# Patient Record
Sex: Female | Born: 1960 | Race: White | Hispanic: No | Marital: Married
Health system: Southern US, Community
[De-identification: ages and names within clinical notes are randomized; demographics above are authoritative.]

---

## 1997-07-26 ENCOUNTER — Ambulatory Visit (HOSPITAL_COMMUNITY): Admission: RE | Admit: 1997-07-26 | Discharge: 1997-07-26 | Payer: Self-pay | Admitting: Infectious Diseases

## 2001-10-28 ENCOUNTER — Encounter: Payer: Self-pay | Admitting: *Deleted

## 2001-10-28 ENCOUNTER — Ambulatory Visit (HOSPITAL_COMMUNITY): Admission: RE | Admit: 2001-10-28 | Discharge: 2001-10-28 | Payer: Self-pay | Admitting: *Deleted

## 2007-01-21 ENCOUNTER — Encounter: Admission: RE | Admit: 2007-01-21 | Discharge: 2007-01-21 | Payer: Self-pay | Admitting: Family Medicine

## 2013-05-21 ENCOUNTER — Other Ambulatory Visit (HOSPITAL_COMMUNITY): Payer: Self-pay | Admitting: Nephrology

## 2013-05-21 ENCOUNTER — Ambulatory Visit (HOSPITAL_COMMUNITY)
Admission: RE | Admit: 2013-05-21 | Discharge: 2013-05-21 | Disposition: A | Discharge status: Home / Self Care | Payer: Managed Care, Other (non HMO) | Source: Ambulatory Visit | Attending: Nephrology | Admitting: Nephrology

## 2013-05-21 DIAGNOSIS — R7611 Nonspecific reaction to tuberculin skin test without active tuberculosis: Secondary | ICD-10-CM | POA: Insufficient documentation

## 2013-05-21 DIAGNOSIS — Z0000 Encounter for general adult medical examination without abnormal findings: Secondary | ICD-10-CM

## 2016-01-13 DIAGNOSIS — J209 Acute bronchitis, unspecified: Secondary | ICD-10-CM | POA: Diagnosis not present

## 2016-04-04 DIAGNOSIS — Z79899 Other long term (current) drug therapy: Secondary | ICD-10-CM | POA: Diagnosis not present

## 2016-04-04 DIAGNOSIS — I10 Essential (primary) hypertension: Secondary | ICD-10-CM | POA: Diagnosis not present

## 2016-04-04 DIAGNOSIS — E7800 Pure hypercholesterolemia, unspecified: Secondary | ICD-10-CM | POA: Diagnosis not present

## 2017-02-27 DIAGNOSIS — E669 Obesity, unspecified: Secondary | ICD-10-CM | POA: Diagnosis not present

## 2017-02-27 DIAGNOSIS — Z0000 Encounter for general adult medical examination without abnormal findings: Secondary | ICD-10-CM | POA: Diagnosis not present

## 2017-02-27 DIAGNOSIS — Z01419 Encounter for gynecological examination (general) (routine) without abnormal findings: Secondary | ICD-10-CM | POA: Diagnosis not present

## 2017-03-18 DIAGNOSIS — E785 Hyperlipidemia, unspecified: Secondary | ICD-10-CM | POA: Diagnosis not present

## 2017-03-18 DIAGNOSIS — Z6841 Body Mass Index (BMI) 40.0 and over, adult: Secondary | ICD-10-CM | POA: Diagnosis not present

## 2017-03-18 DIAGNOSIS — E669 Obesity, unspecified: Secondary | ICD-10-CM | POA: Diagnosis not present

## 2017-07-25 DIAGNOSIS — I83813 Varicose veins of bilateral lower extremities with pain: Secondary | ICD-10-CM | POA: Diagnosis not present

## 2017-07-25 DIAGNOSIS — I83893 Varicose veins of bilateral lower extremities with other complications: Secondary | ICD-10-CM | POA: Diagnosis not present

## 2017-08-21 DIAGNOSIS — I83893 Varicose veins of bilateral lower extremities with other complications: Secondary | ICD-10-CM | POA: Diagnosis not present

## 2017-08-21 DIAGNOSIS — I83813 Varicose veins of bilateral lower extremities with pain: Secondary | ICD-10-CM | POA: Diagnosis not present

## 2017-08-29 DIAGNOSIS — I83813 Varicose veins of bilateral lower extremities with pain: Secondary | ICD-10-CM | POA: Diagnosis not present

## 2017-12-18 DIAGNOSIS — Z6839 Body mass index (BMI) 39.0-39.9, adult: Secondary | ICD-10-CM | POA: Diagnosis not present

## 2017-12-18 DIAGNOSIS — H8110 Benign paroxysmal vertigo, unspecified ear: Secondary | ICD-10-CM | POA: Diagnosis not present

## 2018-04-08 DIAGNOSIS — Z01419 Encounter for gynecological examination (general) (routine) without abnormal findings: Secondary | ICD-10-CM | POA: Diagnosis not present

## 2018-04-08 DIAGNOSIS — I10 Essential (primary) hypertension: Secondary | ICD-10-CM | POA: Diagnosis not present

## 2018-04-08 DIAGNOSIS — N3941 Urge incontinence: Secondary | ICD-10-CM | POA: Diagnosis not present

## 2019-03-06 ENCOUNTER — Other Ambulatory Visit: Payer: Self-pay

## 2019-03-06 ENCOUNTER — Other Ambulatory Visit: Payer: Self-pay | Admitting: Nephrology

## 2019-03-06 ENCOUNTER — Ambulatory Visit
Admission: RE | Admit: 2019-03-06 | Discharge: 2019-03-06 | Disposition: A | Discharge status: Home / Self Care | Payer: 59 | Source: Ambulatory Visit | Attending: Nephrology | Admitting: Nephrology

## 2019-03-06 DIAGNOSIS — Z111 Encounter for screening for respiratory tuberculosis: Secondary | ICD-10-CM

## 2020-05-05 ENCOUNTER — Other Ambulatory Visit: Payer: Self-pay | Admitting: Nephrology

## 2020-05-05 ENCOUNTER — Other Ambulatory Visit: Payer: Self-pay

## 2020-05-05 ENCOUNTER — Ambulatory Visit
Admission: RE | Admit: 2020-05-05 | Discharge: 2020-05-05 | Disposition: A | Discharge status: Home / Self Care | Payer: 59 | Source: Ambulatory Visit | Attending: Nephrology | Admitting: Nephrology

## 2020-05-05 DIAGNOSIS — Z9289 Personal history of other medical treatment: Secondary | ICD-10-CM

## 2021-01-25 IMAGING — CR DG CHEST 2V
2 series · 2 of 2 positions shown · non-contrast
Comparison: 05/21/2013

CLINICAL DATA: Positive PPD

EXAM:
CHEST - 2 VIEW

[w chest pa]
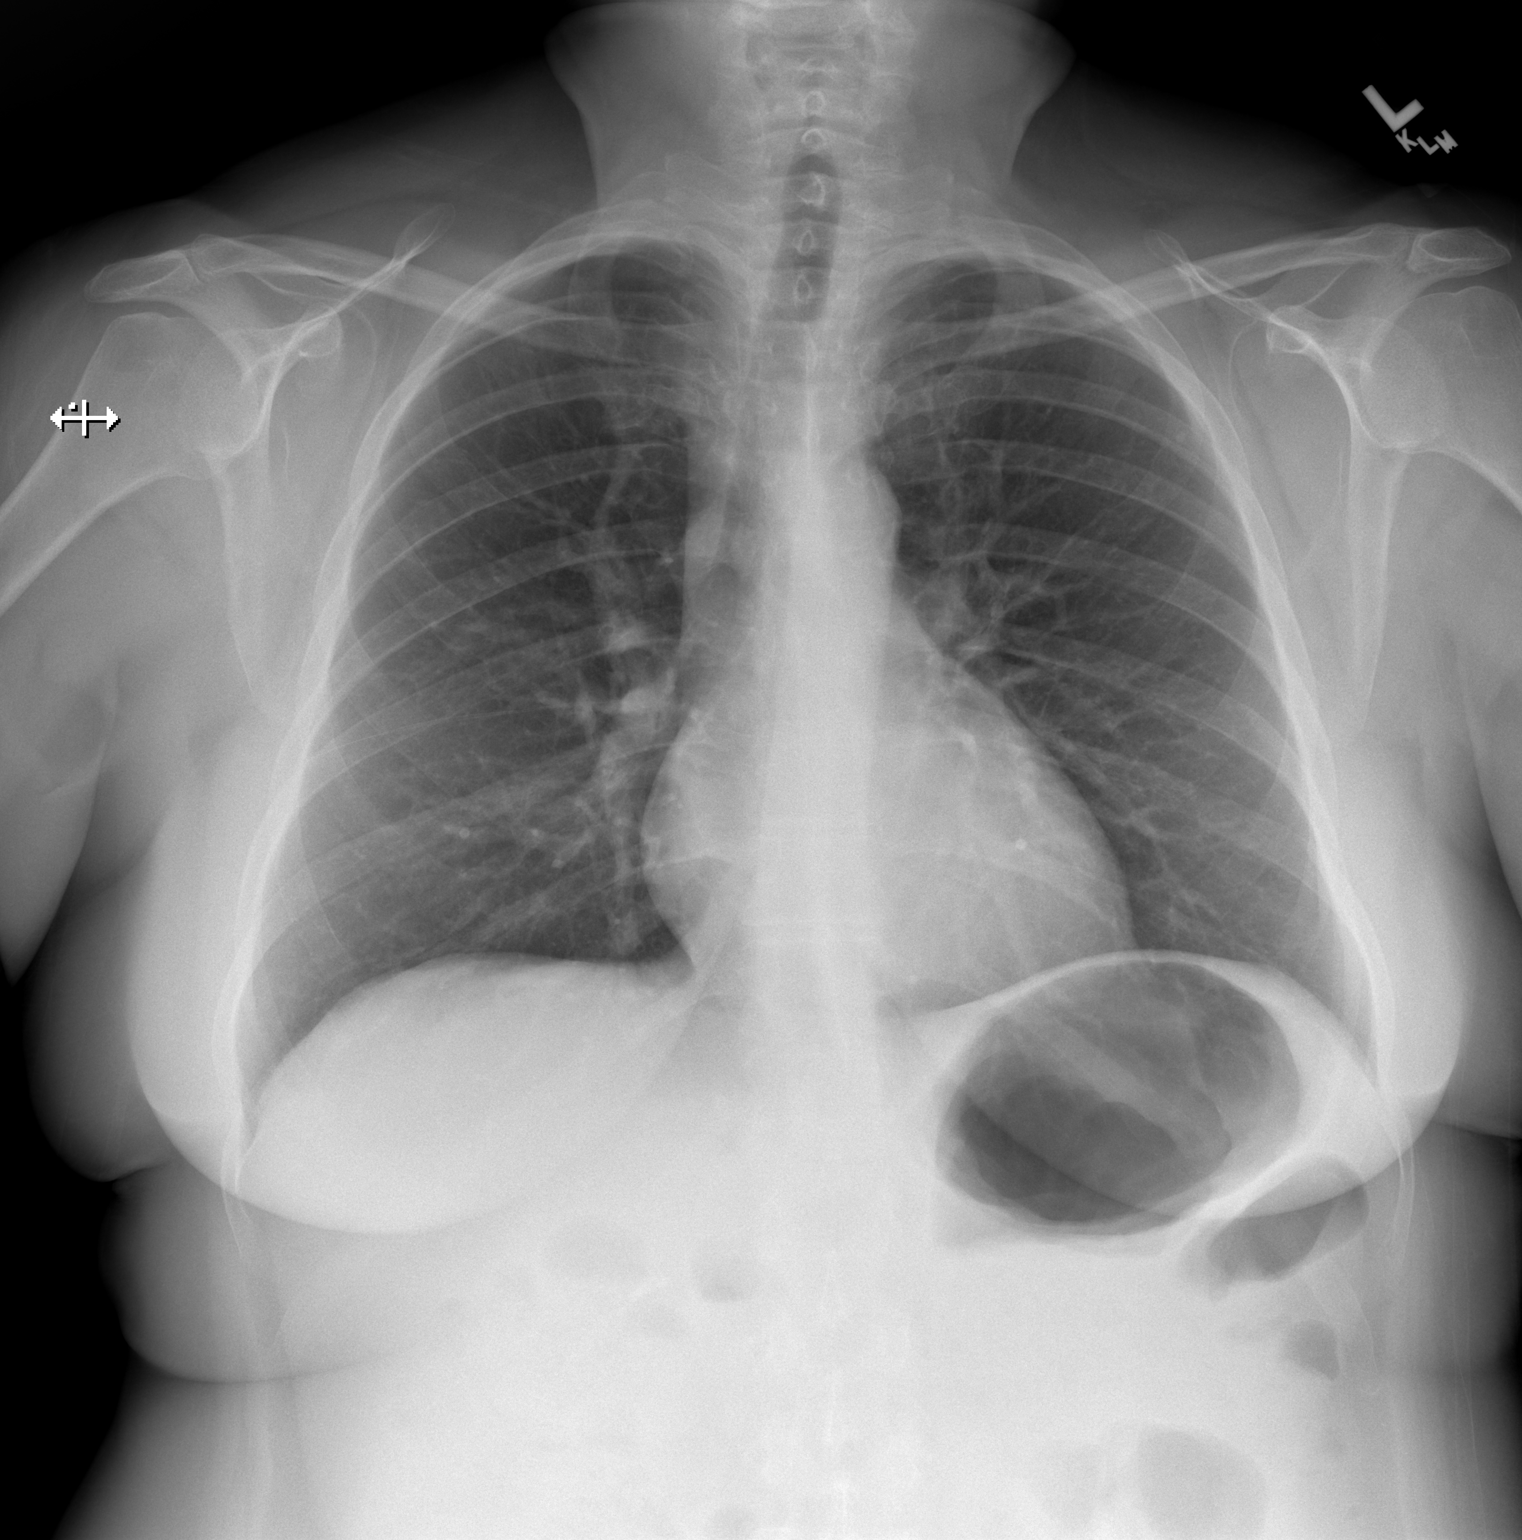

[w chest lat]
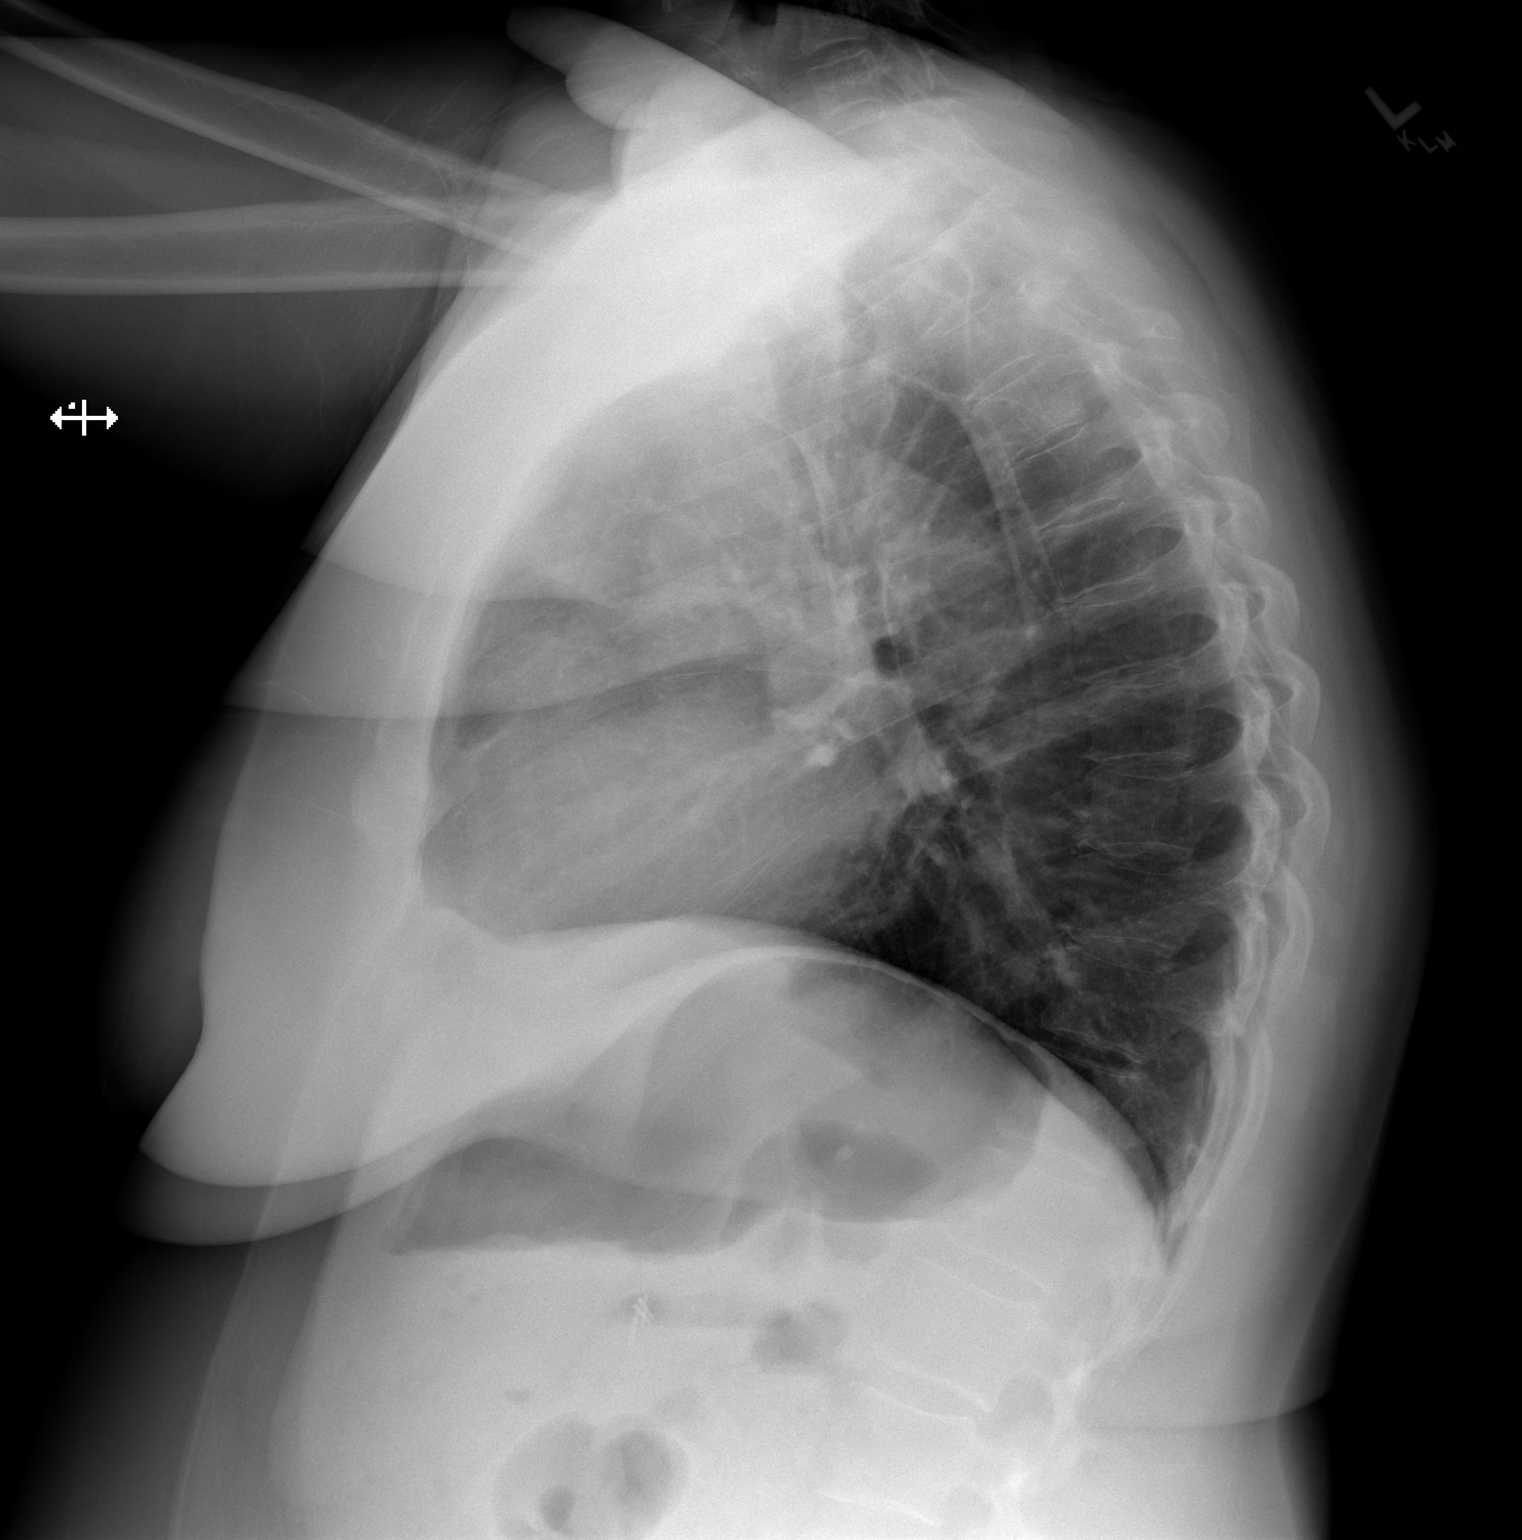

[2 of 2 positions shown; findings below may reference images not displayed]

FINDINGS: The heart size and mediastinal contours are within normal limits.
Both lungs are clear. The visualized skeletal structures are
unremarkable.
IMPRESSION: No active cardiopulmonary disease.

## 2021-05-09 DIAGNOSIS — R079 Chest pain, unspecified: Secondary | ICD-10-CM | POA: Diagnosis not present

## 2021-11-24 DIAGNOSIS — L723 Sebaceous cyst: Secondary | ICD-10-CM | POA: Diagnosis not present

## 2021-11-24 DIAGNOSIS — Z6836 Body mass index (BMI) 36.0-36.9, adult: Secondary | ICD-10-CM | POA: Diagnosis not present

## 2021-12-26 DIAGNOSIS — R32 Unspecified urinary incontinence: Secondary | ICD-10-CM | POA: Diagnosis not present

## 2021-12-26 DIAGNOSIS — Z6834 Body mass index (BMI) 34.0-34.9, adult: Secondary | ICD-10-CM | POA: Diagnosis not present

## 2021-12-26 DIAGNOSIS — F419 Anxiety disorder, unspecified: Secondary | ICD-10-CM | POA: Diagnosis not present

## 2021-12-26 DIAGNOSIS — E785 Hyperlipidemia, unspecified: Secondary | ICD-10-CM | POA: Diagnosis not present

## 2021-12-26 DIAGNOSIS — E669 Obesity, unspecified: Secondary | ICD-10-CM | POA: Diagnosis not present

## 2021-12-26 DIAGNOSIS — I10 Essential (primary) hypertension: Secondary | ICD-10-CM | POA: Diagnosis not present

## 2022-04-10 DIAGNOSIS — H6123 Impacted cerumen, bilateral: Secondary | ICD-10-CM | POA: Diagnosis not present

## 2022-04-10 DIAGNOSIS — R0981 Nasal congestion: Secondary | ICD-10-CM | POA: Diagnosis not present

## 2022-04-10 DIAGNOSIS — R0683 Snoring: Secondary | ICD-10-CM | POA: Diagnosis not present

## 2022-04-10 DIAGNOSIS — J342 Deviated nasal septum: Secondary | ICD-10-CM | POA: Diagnosis not present

## 2022-04-10 DIAGNOSIS — G479 Sleep disorder, unspecified: Secondary | ICD-10-CM | POA: Diagnosis not present

## 2022-04-10 DIAGNOSIS — J343 Hypertrophy of nasal turbinates: Secondary | ICD-10-CM | POA: Diagnosis not present

## 2022-04-10 DIAGNOSIS — H61303 Acquired stenosis of external ear canal, unspecified, bilateral: Secondary | ICD-10-CM | POA: Diagnosis not present

## 2022-05-02 DIAGNOSIS — R400 Somnolence: Secondary | ICD-10-CM | POA: Diagnosis not present

## 2022-05-02 DIAGNOSIS — Z8616 Personal history of COVID-19: Secondary | ICD-10-CM | POA: Diagnosis not present

## 2022-05-02 DIAGNOSIS — J301 Allergic rhinitis due to pollen: Secondary | ICD-10-CM | POA: Diagnosis not present

## 2022-05-02 DIAGNOSIS — R0600 Dyspnea, unspecified: Secondary | ICD-10-CM | POA: Diagnosis not present

## 2022-05-02 DIAGNOSIS — G4733 Obstructive sleep apnea (adult) (pediatric): Secondary | ICD-10-CM | POA: Diagnosis not present

## 2022-05-02 DIAGNOSIS — R5383 Other fatigue: Secondary | ICD-10-CM | POA: Diagnosis not present

## 2022-05-16 DIAGNOSIS — E559 Vitamin D deficiency, unspecified: Secondary | ICD-10-CM | POA: Diagnosis not present

## 2022-05-16 DIAGNOSIS — F4320 Adjustment disorder, unspecified: Secondary | ICD-10-CM | POA: Diagnosis not present

## 2022-05-16 DIAGNOSIS — Z01419 Encounter for gynecological examination (general) (routine) without abnormal findings: Secondary | ICD-10-CM | POA: Diagnosis not present

## 2022-05-16 DIAGNOSIS — I10 Essential (primary) hypertension: Secondary | ICD-10-CM | POA: Diagnosis not present

## 2022-06-18 DIAGNOSIS — Z6835 Body mass index (BMI) 35.0-35.9, adult: Secondary | ICD-10-CM | POA: Diagnosis not present

## 2022-06-18 DIAGNOSIS — Z833 Family history of diabetes mellitus: Secondary | ICD-10-CM | POA: Diagnosis not present

## 2022-06-18 DIAGNOSIS — F329 Major depressive disorder, single episode, unspecified: Secondary | ICD-10-CM | POA: Diagnosis not present

## 2022-06-18 DIAGNOSIS — I10 Essential (primary) hypertension: Secondary | ICD-10-CM | POA: Diagnosis not present

## 2022-06-18 DIAGNOSIS — F419 Anxiety disorder, unspecified: Secondary | ICD-10-CM | POA: Diagnosis not present

## 2022-06-18 DIAGNOSIS — N3941 Urge incontinence: Secondary | ICD-10-CM | POA: Diagnosis not present

## 2022-06-18 DIAGNOSIS — Z8249 Family history of ischemic heart disease and other diseases of the circulatory system: Secondary | ICD-10-CM | POA: Diagnosis not present

## 2022-06-18 DIAGNOSIS — E785 Hyperlipidemia, unspecified: Secondary | ICD-10-CM | POA: Diagnosis not present

## 2022-06-18 DIAGNOSIS — Z823 Family history of stroke: Secondary | ICD-10-CM | POA: Diagnosis not present

## 2022-07-02 DIAGNOSIS — R87613 High grade squamous intraepithelial lesion on cytologic smear of cervix (HGSIL): Secondary | ICD-10-CM | POA: Diagnosis not present

## 2022-07-02 DIAGNOSIS — N888 Other specified noninflammatory disorders of cervix uteri: Secondary | ICD-10-CM | POA: Diagnosis not present

## 2022-07-05 DIAGNOSIS — G4733 Obstructive sleep apnea (adult) (pediatric): Secondary | ICD-10-CM | POA: Diagnosis not present

## 2022-08-01 DIAGNOSIS — R400 Somnolence: Secondary | ICD-10-CM | POA: Diagnosis not present

## 2022-08-01 DIAGNOSIS — Z8616 Personal history of COVID-19: Secondary | ICD-10-CM | POA: Diagnosis not present

## 2022-08-01 DIAGNOSIS — R5383 Other fatigue: Secondary | ICD-10-CM | POA: Diagnosis not present

## 2022-08-01 DIAGNOSIS — J301 Allergic rhinitis due to pollen: Secondary | ICD-10-CM | POA: Diagnosis not present

## 2022-08-01 DIAGNOSIS — G4733 Obstructive sleep apnea (adult) (pediatric): Secondary | ICD-10-CM | POA: Diagnosis not present

## 2022-08-08 DIAGNOSIS — R87613 High grade squamous intraepithelial lesion on cytologic smear of cervix (HGSIL): Secondary | ICD-10-CM | POA: Diagnosis not present

## 2022-09-07 DIAGNOSIS — N879 Dysplasia of cervix uteri, unspecified: Secondary | ICD-10-CM | POA: Diagnosis not present

## 2022-09-07 DIAGNOSIS — R87613 High grade squamous intraepithelial lesion on cytologic smear of cervix (HGSIL): Secondary | ICD-10-CM | POA: Diagnosis not present

## 2022-10-05 DIAGNOSIS — D069 Carcinoma in situ of cervix, unspecified: Secondary | ICD-10-CM | POA: Diagnosis not present

## 2022-12-07 DIAGNOSIS — G4733 Obstructive sleep apnea (adult) (pediatric): Secondary | ICD-10-CM | POA: Diagnosis not present

## 2022-12-16 DIAGNOSIS — G4733 Obstructive sleep apnea (adult) (pediatric): Secondary | ICD-10-CM | POA: Diagnosis not present

## 2022-12-27 DIAGNOSIS — G4733 Obstructive sleep apnea (adult) (pediatric): Secondary | ICD-10-CM | POA: Diagnosis not present

## 2023-01-16 DIAGNOSIS — G4733 Obstructive sleep apnea (adult) (pediatric): Secondary | ICD-10-CM | POA: Diagnosis not present

## 2023-02-16 DIAGNOSIS — G4733 Obstructive sleep apnea (adult) (pediatric): Secondary | ICD-10-CM | POA: Diagnosis not present

## 2023-03-16 DIAGNOSIS — G4733 Obstructive sleep apnea (adult) (pediatric): Secondary | ICD-10-CM | POA: Diagnosis not present

## 2023-03-27 DIAGNOSIS — I700 Atherosclerosis of aorta: Secondary | ICD-10-CM | POA: Diagnosis not present

## 2023-03-27 DIAGNOSIS — G4733 Obstructive sleep apnea (adult) (pediatric): Secondary | ICD-10-CM | POA: Diagnosis not present

## 2023-03-27 DIAGNOSIS — F321 Major depressive disorder, single episode, moderate: Secondary | ICD-10-CM | POA: Diagnosis not present

## 2023-03-27 DIAGNOSIS — Z823 Family history of stroke: Secondary | ICD-10-CM | POA: Diagnosis not present

## 2023-03-27 DIAGNOSIS — N3941 Urge incontinence: Secondary | ICD-10-CM | POA: Diagnosis not present

## 2023-03-27 DIAGNOSIS — Z8249 Family history of ischemic heart disease and other diseases of the circulatory system: Secondary | ICD-10-CM | POA: Diagnosis not present

## 2023-03-27 DIAGNOSIS — N319 Neuromuscular dysfunction of bladder, unspecified: Secondary | ICD-10-CM | POA: Diagnosis not present

## 2023-03-27 DIAGNOSIS — I10 Essential (primary) hypertension: Secondary | ICD-10-CM | POA: Diagnosis not present

## 2023-03-27 DIAGNOSIS — Z6837 Body mass index (BMI) 37.0-37.9, adult: Secondary | ICD-10-CM | POA: Diagnosis not present

## 2023-03-27 DIAGNOSIS — I25119 Atherosclerotic heart disease of native coronary artery with unspecified angina pectoris: Secondary | ICD-10-CM | POA: Diagnosis not present

## 2023-03-27 DIAGNOSIS — Z833 Family history of diabetes mellitus: Secondary | ICD-10-CM | POA: Diagnosis not present

## 2023-04-16 DIAGNOSIS — G4733 Obstructive sleep apnea (adult) (pediatric): Secondary | ICD-10-CM | POA: Diagnosis not present

## 2023-05-16 DIAGNOSIS — G4733 Obstructive sleep apnea (adult) (pediatric): Secondary | ICD-10-CM | POA: Diagnosis not present
